# Patient Record
Sex: Male | Born: 1975 | Race: White | Hispanic: No | Marital: Married | Smoking: Never smoker
Health system: Southern US, Community
[De-identification: ages and names within clinical notes are randomized; demographics above are authoritative.]

## PROBLEM LIST (undated history)

## (undated) HISTORY — PX: VASECTOMY: SHX75

---

## 2016-02-06 ENCOUNTER — Emergency Department (HOSPITAL_COMMUNITY): Payer: Self-pay

## 2016-02-06 ENCOUNTER — Emergency Department (HOSPITAL_COMMUNITY)
Admission: EM | Admit: 2016-02-06 | Discharge: 2016-02-06 | Disposition: A | Discharge status: Home / Self Care | Payer: Self-pay | Attending: Emergency Medicine | Admitting: Emergency Medicine

## 2016-02-06 ENCOUNTER — Encounter (HOSPITAL_COMMUNITY): Payer: Self-pay | Admitting: Emergency Medicine

## 2016-02-06 DIAGNOSIS — Y999 Unspecified external cause status: Secondary | ICD-10-CM | POA: Insufficient documentation

## 2016-02-06 DIAGNOSIS — Y939 Activity, unspecified: Secondary | ICD-10-CM | POA: Insufficient documentation

## 2016-02-06 DIAGNOSIS — S42022A Displaced fracture of shaft of left clavicle, initial encounter for closed fracture: Secondary | ICD-10-CM | POA: Insufficient documentation

## 2016-02-06 DIAGNOSIS — M25552 Pain in left hip: Secondary | ICD-10-CM | POA: Insufficient documentation

## 2016-02-06 DIAGNOSIS — Y92410 Unspecified street and highway as the place of occurrence of the external cause: Secondary | ICD-10-CM | POA: Insufficient documentation

## 2016-02-06 MED ORDER — OXYCODONE-ACETAMINOPHEN 5-325 MG PO TABS: 1.0000 | ORAL_TABLET | ORAL | 0 refills | Status: AC | PRN

## 2016-02-06 MED ORDER — OXYCODONE-ACETAMINOPHEN 5-325 MG PO TABS
1.0000 | ORAL_TABLET | Freq: Once | ORAL | Status: AC
  Administered 2016-02-06: 1 via ORAL
  Filled 2016-02-06: qty 1
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
## 2016-02-06 NOTE — Discharge Instructions (Signed)
Take the prescribed medication as directed.  Do not drive while taking this medication. Make sure to wear the sling at all times, especially when walking and moving about. Follow-up with orthopedics to ensure your fracture heals appropriately.  Call office to make appt. Return to the ED for new or worsening symptoms.

## 2016-06-13 DIAGNOSIS — S42022G Displaced fracture of shaft of left clavicle, subsequent encounter for fracture with delayed healing: Secondary | ICD-10-CM | POA: Diagnosis not present

## 2016-06-24 DIAGNOSIS — S42022K Displaced fracture of shaft of left clavicle, subsequent encounter for fracture with nonunion: Secondary | ICD-10-CM | POA: Diagnosis not present

## 2016-07-20 DIAGNOSIS — S42022G Displaced fracture of shaft of left clavicle, subsequent encounter for fracture with delayed healing: Secondary | ICD-10-CM | POA: Diagnosis not present

## 2016-10-17 DIAGNOSIS — S42022G Displaced fracture of shaft of left clavicle, subsequent encounter for fracture with delayed healing: Secondary | ICD-10-CM | POA: Diagnosis not present

## 2016-11-21 DIAGNOSIS — S42002D Fracture of unspecified part of left clavicle, subsequent encounter for fracture with routine healing: Secondary | ICD-10-CM | POA: Diagnosis not present

## 2017-01-04 DIAGNOSIS — T84498A Other mechanical complication of other internal orthopedic devices, implants and grafts, initial encounter: Secondary | ICD-10-CM | POA: Diagnosis not present

## 2017-01-04 DIAGNOSIS — T84428A Displacement of other internal orthopedic devices, implants and grafts, initial encounter: Secondary | ICD-10-CM | POA: Diagnosis not present

## 2017-01-12 DIAGNOSIS — S42002D Fracture of unspecified part of left clavicle, subsequent encounter for fracture with routine healing: Secondary | ICD-10-CM | POA: Diagnosis not present

## 2017-05-23 DIAGNOSIS — F4320 Adjustment disorder, unspecified: Secondary | ICD-10-CM | POA: Diagnosis not present
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
## 2018-02-19 DIAGNOSIS — F4320 Adjustment disorder, unspecified: Secondary | ICD-10-CM | POA: Diagnosis not present

## 2018-04-11 DIAGNOSIS — J069 Acute upper respiratory infection, unspecified: Secondary | ICD-10-CM | POA: Diagnosis not present

## 2018-04-20 DIAGNOSIS — I10 Essential (primary) hypertension: Secondary | ICD-10-CM | POA: Diagnosis not present

## 2018-05-29 DIAGNOSIS — I10 Essential (primary) hypertension: Secondary | ICD-10-CM | POA: Diagnosis not present

## 2018-06-26 DIAGNOSIS — F329 Major depressive disorder, single episode, unspecified: Secondary | ICD-10-CM | POA: Diagnosis not present

## 2018-06-26 DIAGNOSIS — I10 Essential (primary) hypertension: Secondary | ICD-10-CM | POA: Diagnosis not present
